# Patient Record
Sex: Male | Born: 1965 | Race: White | Hispanic: No | Marital: Married | Smoking: Never smoker
Health system: Southern US, Community
[De-identification: ages and names within clinical notes are randomized; demographics above are authoritative.]

## PROBLEM LIST (undated history)

## (undated) ENCOUNTER — Ambulatory Visit: Admission: EM | Discharge status: Absent without leave | Payer: Self-pay

## (undated) HISTORY — PX: NO PAST SURGERIES: SHX2092

---

## 2006-01-19 ENCOUNTER — Ambulatory Visit: Payer: Self-pay

## 2006-02-07 ENCOUNTER — Ambulatory Visit: Payer: Self-pay

## 2007-12-22 IMAGING — US ABDOMEN ULTRASOUND
1 series · 17 of 25 positions shown · non-contrast
Comparison: none

REASON FOR EXAM: RUQ pain
COMMENTS:

[Series 1: abdomen ultrasound · 17 of 58 slices shown]
[im 1/58]
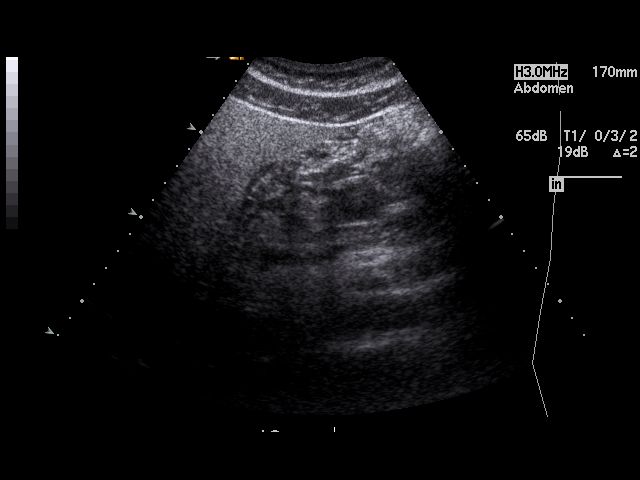
[im 5/58]
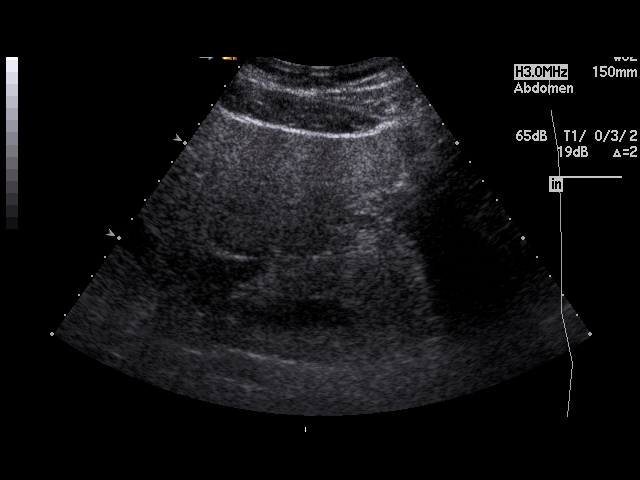
[im 8/58]
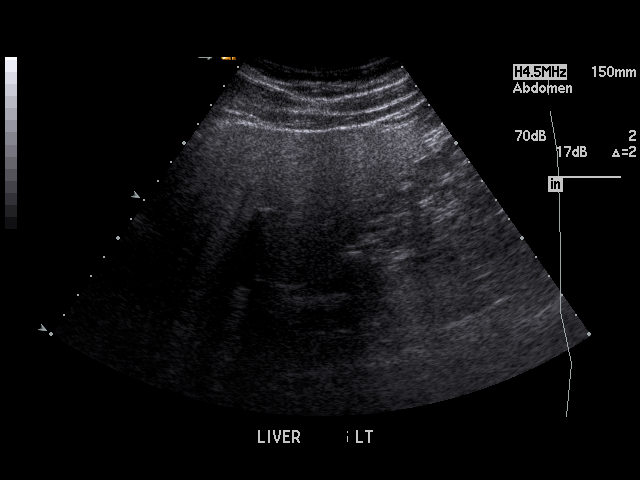
[im 12/58]
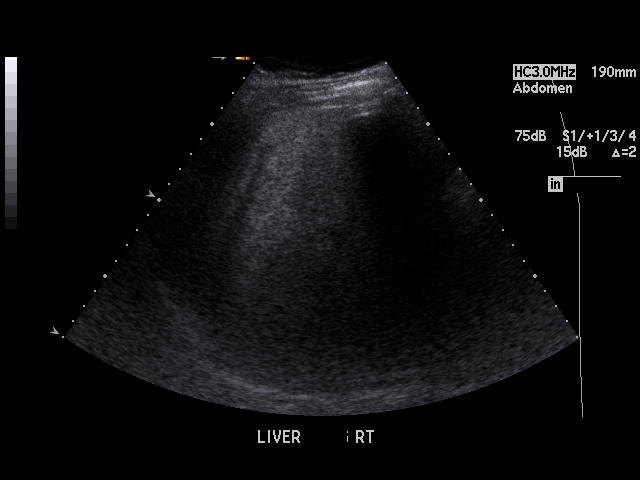
[im 15/58]
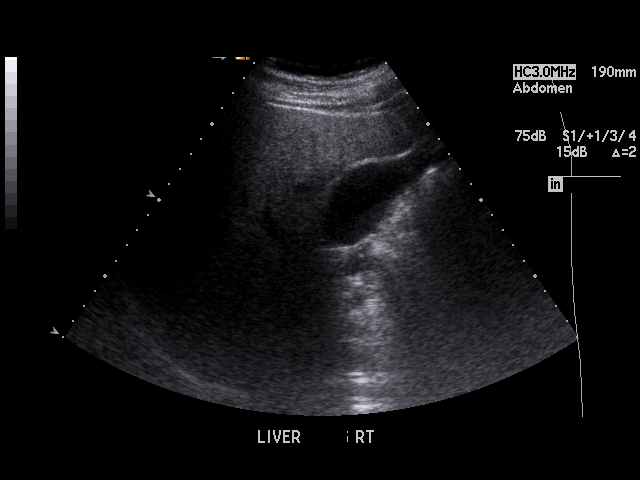
[im 20/58]
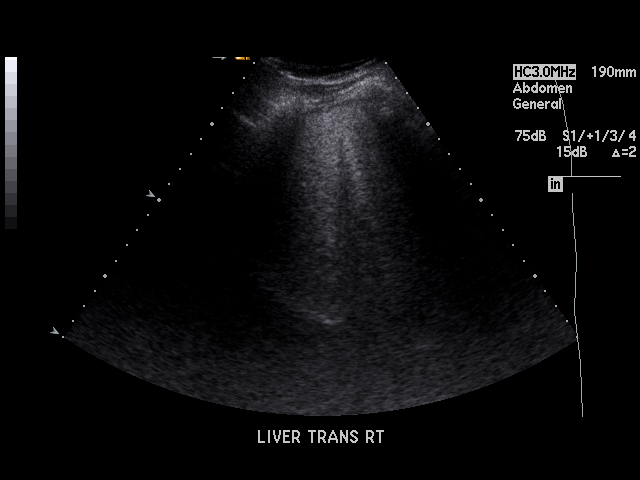
[im 22/58]
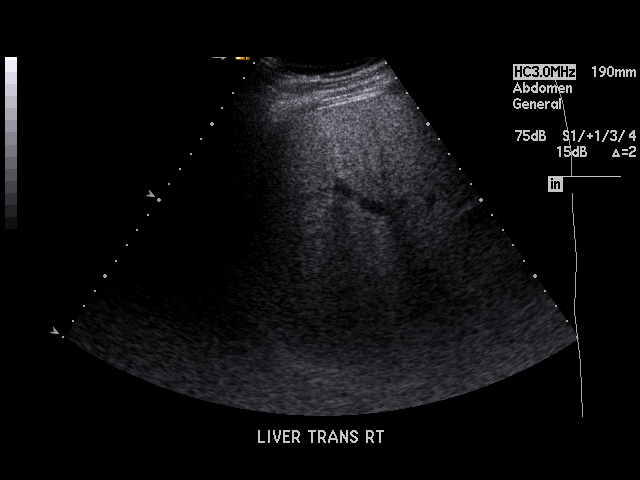
[im 27/58]
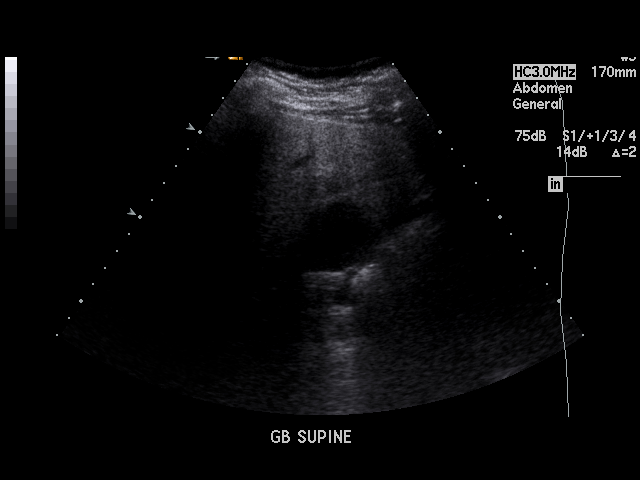
[im 29/58]
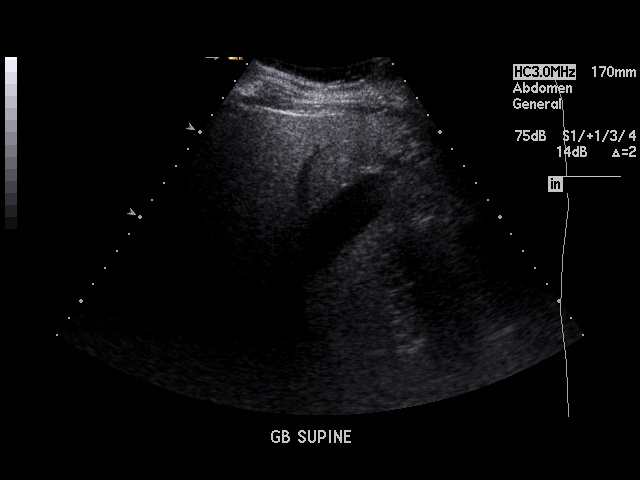
[im 31/58]
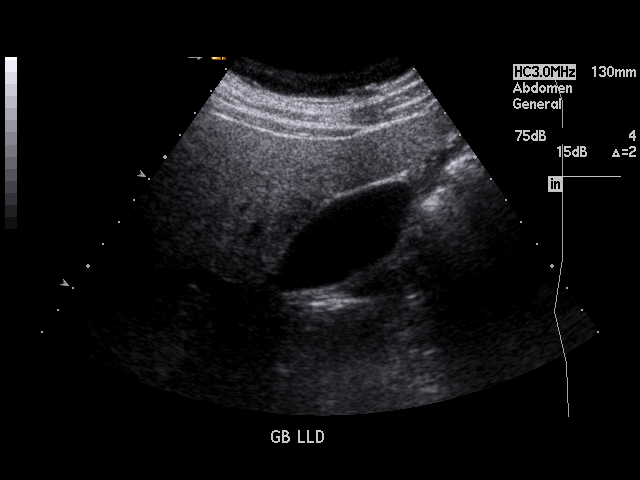
[im 36/58]
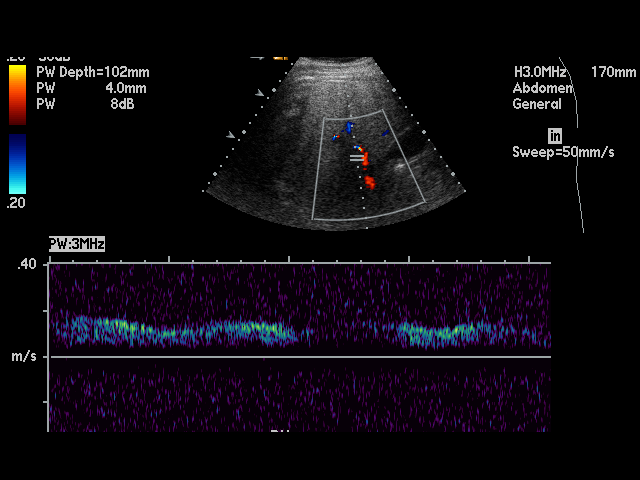
[im 39/58]
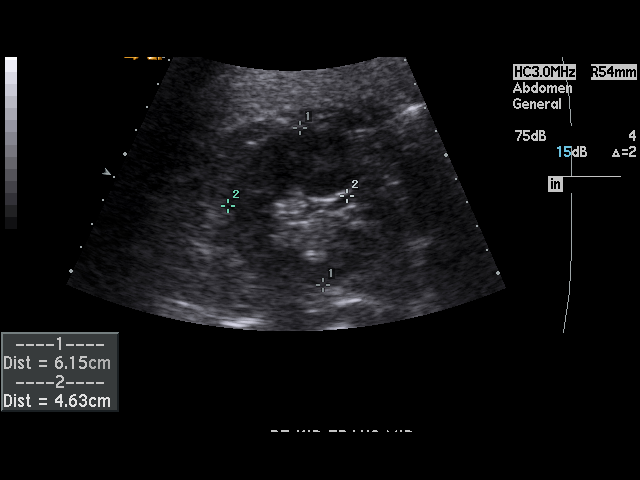
[im 43/58]
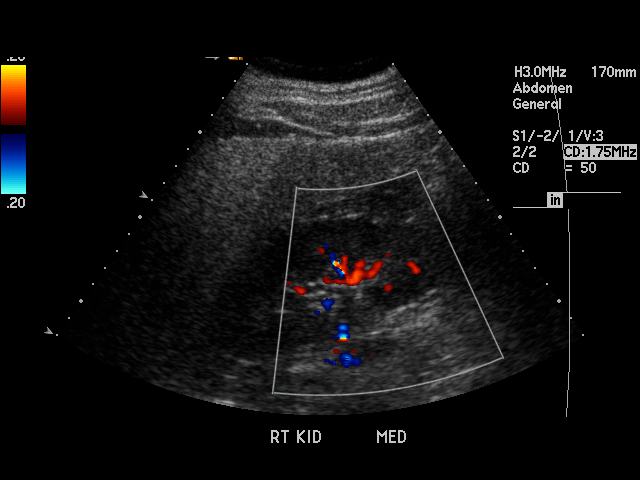
[im 46/58]
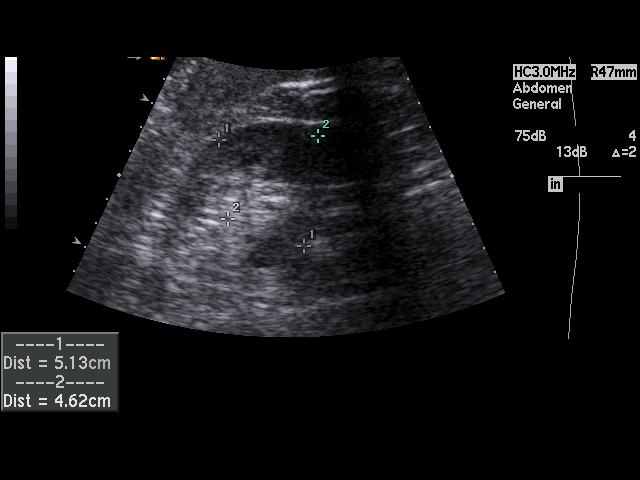
[im 50/58]
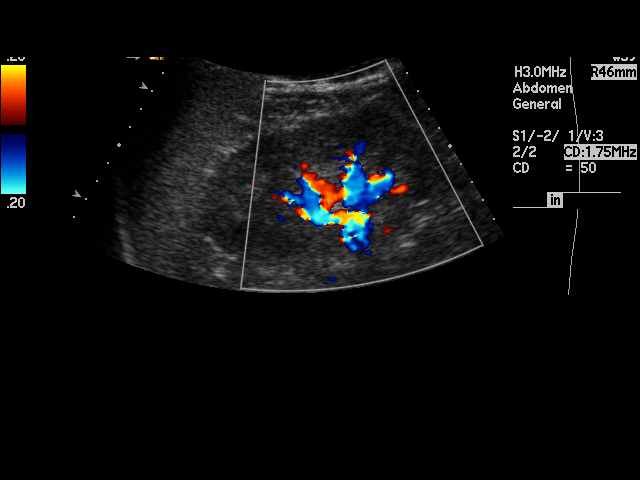
[im 53/58]
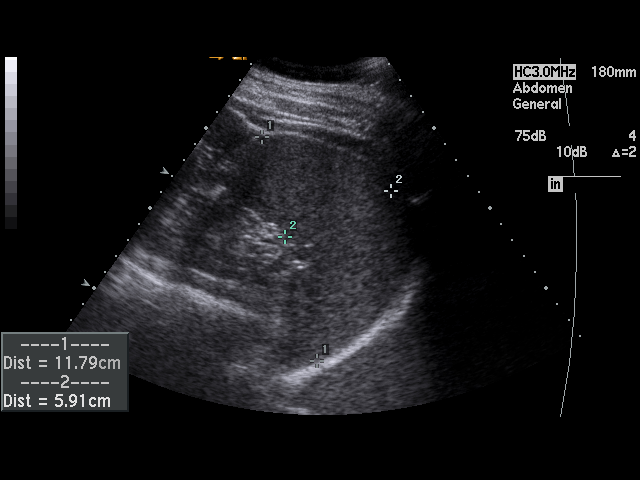
[im 58/58]
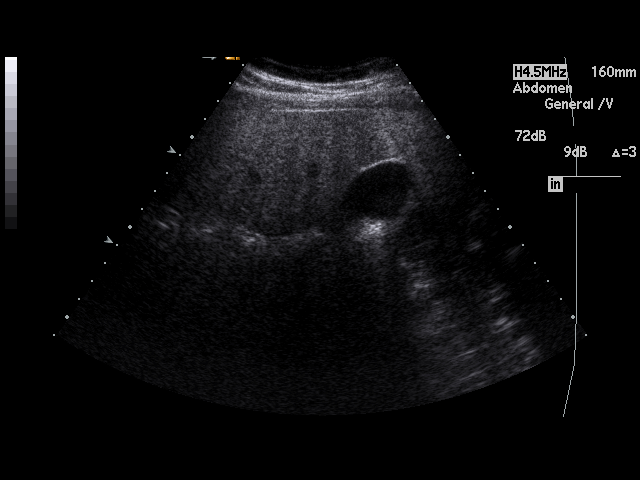

[17 of 25 positions shown; findings below may reference images not displayed]

PROCEDURE:     US  - US ABDOMEN GENERAL SURVEY  - January 19, 2006 [DATE]

RESULT:     The patient is complaining of RIGHT upper quadrant discomfort.
The gallbladder is adequately distended with no evidence of stones, wall
thickening, or pericholecystic fluid.  There is no sonographic Murphy's
sign.  The common bile duct is normal at 1.9 mm in diameter.  The liver
exhibits somewhat increased echotexture and it is difficult to penetrate
with the ultrasound beam suggesting fatty infiltration.  The portal venous
flow is normal in flow direction.  The pancreas can only be partially imaged
due to bowel gas and is grossly normal.  The spleen is normal in echotexture
and size measuring 12.7 cm in greatest dimension.  Survey views of the
abdominal aorta exhibit no evidence of aneurysm. The kidneys are normal in
echotexture and measure approximately 12.9 cm in length on the RIGHT and
12.8 cm in length on the LEFT.  There is no evidence of ascites.
IMPRESSION: I see no evidence of gallstones.

The liver exhibits parenchymal change consistent with fatty infiltration.

## 2009-01-03 ENCOUNTER — Ambulatory Visit: Payer: Self-pay | Admitting: Family Medicine

## 2010-12-14 ENCOUNTER — Ambulatory Visit: Payer: Self-pay | Admitting: Internal Medicine

## 2011-04-12 ENCOUNTER — Ambulatory Visit: Payer: Self-pay | Admitting: Family Medicine

## 2011-10-05 ENCOUNTER — Ambulatory Visit: Payer: Self-pay | Admitting: Internal Medicine

## 2011-10-08 LAB — BETA STREP CULTURE(ARMC)

## 2013-08-24 ENCOUNTER — Ambulatory Visit: Payer: Self-pay

## 2015-05-17 ENCOUNTER — Ambulatory Visit
Admission: EM | Admit: 2015-05-17 | Discharge: 2015-05-17 | Disposition: A | Discharge status: Home / Self Care | Payer: BLUE CROSS/BLUE SHIELD | Attending: Family Medicine | Admitting: Family Medicine

## 2015-05-17 ENCOUNTER — Encounter: Payer: Self-pay | Admitting: Emergency Medicine

## 2015-05-17 ENCOUNTER — Ambulatory Visit (INDEPENDENT_AMBULATORY_CARE_PROVIDER_SITE_OTHER): Payer: BLUE CROSS/BLUE SHIELD

## 2015-05-17 DIAGNOSIS — J189 Pneumonia, unspecified organism: Secondary | ICD-10-CM

## 2015-05-17 MED ORDER — HYDROCOD POLST-CPM POLST ER 10-8 MG/5ML PO SUER: 5.0000 mL | Freq: Two times a day (BID) | ORAL | Status: DC

## 2015-05-17 MED ORDER — AZITHROMYCIN 250 MG PO TABS: ORAL_TABLET | ORAL | Status: DC

## 2015-05-17 NOTE — Discharge Instructions (Signed)

## 2015-05-17 NOTE — ED Provider Notes (Signed)
CSN: [id number]     Arrival date & time 05/17/15  0909 History   First MD Initiated Contact with Patient 05/17/15 1058     Chief Complaint  Patient presents with  . URI   (Consider location/radiation/quality/duration/timing/severity/associated sxs/prior Treatment) HPI   49 year old gentleman who has had a cold symptoms upper respiratory for the last week. 2 days ago he began to develop a cough for pain in his chest when he coughed hard. He's had a fever for about 101 for 2 days although today he is afebrile this morning. Has been productive of a foul tasting brownish mucus he is having every time he coughs. He is a nonsmoker. His 8410-month-old daughter has had these symptoms before she has improved without a problem  History reviewed. No pertinent past medical history. Past Surgical History  Procedure Laterality Date  . No past surgeries     No family history on file. Social History  Substance Use Topics  . Smoking status: Never Smoker   . Smokeless tobacco: Never Used  . Alcohol Use: Yes    Review of Systems  Constitutional: Positive for fever and chills. Negative for diaphoresis, activity change, appetite change and fatigue.  HENT: Positive for congestion, postnasal drip, rhinorrhea and sinus pressure.   Respiratory: Positive for choking.   All other systems reviewed and are negative.   Allergies  Review of patient's allergies indicates no known allergies.  Home Medications   Prior to Admission medications   Medication Sig Start Date End Date Taking? Authorizing Provider  azithromycin (ZITHROMAX Z-PAK) 250 MG tablet Use as per package instructions 05/17/15   Lutricia FeilWilliam P Roemer, PA-C  chlorpheniramine-HYDROcodone [facility](TUSSIONEX PENNKINETIC ER) 10-8 MG/5ML SUER Take 5 mLs by mouth 2 (two) times daily. 05/17/15   Lutricia FeilWilliam P Roemer, PA-C   Meds Ordered and Administered this Visit  Medications - No data to display  BP 117/74 mmHg  Pulse 63  Temp(Src) 97.6 F (36.4 C) (Oral)   Resp 18  Ht 5\' 11"  (1.803 m)  Wt 245 lb (111.131 kg)  BMI 34.19 kg/m2  SpO2 99% No data found.   Physical Exam  Constitutional: He is oriented to person, place, and time. He appears well-developed and well-nourished. No distress.  HENT:  Head: Normocephalic and atraumatic.  TMs are dull bilaterally with decreased light reflex. Is tender to percussion over the maxillary sinuses bilaterally  Eyes: Conjunctivae are normal. Pupils are equal, round, and reactive to light.  Neck: Normal range of motion. Neck supple.  Pulmonary/Chest: Effort normal. No respiratory distress. He has no wheezes. He has rales.  Auscultation of the lungs shows a small patch of fine crackles on tussive in the right middle lobe.  Musculoskeletal: Normal range of motion. He exhibits no edema or tenderness.  Lymphadenopathy:    He has no cervical adenopathy.  Neurological: He is alert and oriented to person, place, and time.  Skin: Skin is warm and dry. He is not diaphoretic.  Psychiatric: He has a normal mood and affect. His behavior is normal. Judgment and thought content normal.  Nursing note and vitals reviewed.   ED Course  Procedures (including critical care time)  Labs Review Labs Reviewed - No data to display  Imaging Review Dg Chest 2 View  05/17/2015  CLINICAL DATA:  Cough.  Fever EXAM: CHEST  2 VIEW COMPARISON:  None. FINDINGS: The heart size and mediastinal contours are within normal limits. Lingular opacity compatible with pneumonia. The visualized skeletal structures are unremarkable. IMPRESSION: 1. Lingular pneumonia. Electronically  Signed   By: Signa Kell M.D.   On: 05/17/2015 11:38     Visual Acuity Review  Right Eye Distance:   Left Eye Distance:   Bilateral Distance:    Right Eye Near:   Left Eye Near:    Bilateral Near:         MDM   1. Lingular pneumonia    New Prescriptions   AZITHROMYCIN (ZITHROMAX Z-PAK) 250 MG TABLET    Use as per package instructions    CHLORPHENIRAMINE-HYDROCODONE (TUSSIONEX PENNKINETIC ER) 10-8 MG/5ML SUER    Take 5 mLs by mouth 2 (two) times daily.  Plan: 1. Test/x-ray results and diagnosis reviewed with patient 2. rx as per orders; risks, benefits, potential side effects reviewed with patient 3. Recommend supportive treatment with fluids and rest. Complete the antibiotics. Follow-up with [facility] in 4 weeks for repeat x-ray to confirm cure. He has good emergency room in the interim if there is any increase in symptoms or if he is worsening. 4. F/u prn if symptoms worsen or don't improve     Lutricia Feil, PA-C 05/17/15 1213

## 2015-05-17 NOTE — ED Notes (Signed)
Cough, congestion, fever 101 for 2 day

## 2016-09-22 ENCOUNTER — Ambulatory Visit
Admission: EM | Admit: 2016-09-22 | Discharge: 2016-09-22 | Disposition: A | Discharge status: Home / Self Care | Payer: BLUE CROSS/BLUE SHIELD | Attending: Family Medicine | Admitting: Family Medicine

## 2016-09-22 ENCOUNTER — Encounter: Payer: Self-pay | Admitting: *Deleted

## 2016-09-22 DIAGNOSIS — J069 Acute upper respiratory infection, unspecified: Secondary | ICD-10-CM | POA: Diagnosis not present

## 2016-09-22 DIAGNOSIS — B9789 Other viral agents as the cause of diseases classified elsewhere: Secondary | ICD-10-CM | POA: Diagnosis not present

## 2016-09-22 DIAGNOSIS — R05 Cough: Secondary | ICD-10-CM

## 2016-09-22 MED ORDER — BENZONATATE 100 MG PO CAPS: 100.0000 mg | ORAL_CAPSULE | Freq: Three times a day (TID) | ORAL | 0 refills | Status: DC | PRN

## 2016-09-22 MED ORDER — PREDNISONE 20 MG PO TABS: 40.0000 mg | ORAL_TABLET | Freq: Every day | ORAL | 0 refills | Status: DC

## 2016-09-22 MED ORDER — DOXYCYCLINE HYCLATE 100 MG PO CAPS: 100.0000 mg | ORAL_CAPSULE | Freq: Two times a day (BID) | ORAL | 0 refills | Status: DC

## 2016-09-22 MED ORDER — HYDROCOD POLST-CPM POLST ER 10-8 MG/5ML PO SUER: 5.0000 mL | Freq: Every evening | ORAL | 0 refills | Status: DC | PRN

## 2016-09-22 MED ORDER — ALBUTEROL SULFATE HFA 108 (90 BASE) MCG/ACT IN AERS: 2.0000 | INHALATION_SPRAY | RESPIRATORY_TRACT | 0 refills | Status: DC | PRN

## 2016-09-22 NOTE — ED Triage Notes (Signed)
Productive cough- green, head and chest congestion,x1 week.

## 2016-09-22 NOTE — ED Provider Notes (Signed)
[facility] ____________________________________________  Time seen: Approximately 6:51 PM  I have reviewed the triage vital signs and the nursing notes.   HISTORY  Chief Complaint Cough and Nasal Congestion   HPI Malik Chapman is a 51 y.o. male presenting for evaluation of 1 week of nasal congestion, post nasal drainage and cough. Patient reports initially started with nasal congestion and quickly progressed to chest congestion and coughing. Patient reports some soreness from coughing. States that he had does have a tickle in his throat that causes coughing denies sore throat. Denies chest pain or shortness of breath. Denies known sick contacts. Reports is continue to remain active. Reports he does have some seasonal allergy history recently and has been taking home antihistamine. Reports symptoms have been unresolved over-the-counter cough and congestion medications. Denies and fevers. Reports continues to eat and drink well. Denies fevers.   Denies chest pain, shortness of breath, abdominal pain, dysuria, extremity pain, extremity swelling or rash. Denies recent sickness. Denies recent antibiotic use.  Denies chronic medical problems. History reviewed. No pertinent past medical history.  There are no active problems to display for this patient.   Past Surgical History:  Procedure Laterality Date  . NO PAST SURGERIES       No current facility-administered medications for this encounter.   Current Outpatient Prescriptions:  .  albuterol (PROVENTIL HFA;VENTOLIN HFA) 108 (90 Base) MCG/ACT inhaler, Inhale 2 puffs into the lungs every 4 (four) hours as needed for wheezing., Disp: 1 Inhaler, Rfl: 0 .  benzonatate (TESSALON PERLES) 100 MG capsule, Take 1 capsule (100 mg total) by mouth 3 (three) times daily as needed., Disp: 15 capsule, Rfl: 0 .  chlorpheniramine-HYDROcodone (TUSSIONEX PENNKINETIC ER) 10-8 MG/5ML SUER, Take 5 mLs by mouth at bedtime as needed. do not  drive or operate machinery while taking as can cause drowsiness., Disp: 75 mL, Rfl: 0 .  doxycycline (VIBRAMYCIN) 100 MG capsule, Take 1 capsule (100 mg total) by mouth 2 (two) times daily., Disp: 20 capsule, Rfl: 0 .  predniSONE (DELTASONE) 20 MG tablet, Take 2 tablets (40 mg total) by mouth daily., Disp: 10 tablet, Rfl: 0  Allergies Patient has no known allergies.  History reviewed. No pertinent family history.  Social History Social History  Substance Use Topics  . Smoking status: Never Smoker  . Smokeless tobacco: Never Used  . Alcohol use Yes    Review of Systems Constitutional: No fever/chills Eyes: No visual changes. ENT: No sore throat. Cardiovascular: Denies chest pain. Respiratory: Denies shortness of breath. Gastrointestinal: No abdominal pain.  No nausea, no vomiting.  No diarrhea.  No constipation. Genitourinary: Negative for dysuria. Musculoskeletal: Negative for back pain. Skin: Negative for rash.  ____________________________________________   PHYSICAL EXAM:  VITAL SIGNS: ED Triage Vitals  Enc Vitals Group     BP 09/22/16 1722 (!) 145/79     Pulse Rate 09/22/16 1722 63     Resp 09/22/16 1722 16     Temp 09/22/16 1722 98.6 F (37 C)     Temp Source 09/22/16 1722 Oral     SpO2 09/22/16 1722 99 %     Weight 09/22/16 1724 250 lb (113.4 kg)     Height 09/22/16 1724  (1.803 m)     Head Circumference --      Peak Flow --      Pain Score 09/22/16 1848 4     Pain Loc --      Pain Edu? --      Excl.  in GC? --     Constitutional: Alert and oriented. Well appearing and in no acute distress. Eyes: Conjunctivae are normal. PERRL. EOMI. Head: Atraumatic. No sinus tenderness to palpation. No swelling. No erythema.  Ears: no erythema, normal TMs bilaterally.   Nose:Nasal congestion with clear rhinorrhea  Mouth/Throat: Mucous membranes are moist. No pharyngeal erythema. No tonsillar swelling or exudate.  Neck: No stridor.  No cervical spine tenderness to  palpation. Hematological/Lymphatic/Immunilogical: No cervical lymphadenopathy. Cardiovascular: Normal rate, regular rhythm. Grossly normal heart sounds.  Good peripheral circulation. Respiratory: Normal respiratory effort.  No retractions. No wheezes, rales or rhonchi. Good air movement. Dry intermittent cough noted in room with bronchospasm. No focal area of consolidation auscultated. Speaks in complete sentences. Musculoskeletal: Ambulatory with steady gait. No cervical, thoracic or lumbar tenderness to palpation. No lower extremity edema noted bilaterally. Neurologic:  Normal speech and language. No gait instability. Skin:  Skin appears warm, dry and intact. No rash noted. Psychiatric: Mood and affect are normal. Speech and behavior are normal.  ___________________________________________   LABS (all labs ordered are listed, but only abnormal results are displayed)  Labs Reviewed - No data to display  PROCEDURES Procedures    INITIAL IMPRESSION / ASSESSMENT AND PLAN / ED COURSE  Pertinent labs & imaging results that were available during my care of the patient were reviewed by me and considered in my medical decision making (see chart for details).  Well-appearing patient. No acute distress. Discussed the patient suspects some allergic rhinitis and viral upper respiratory infection with cough.  concerned with bronchospasm will empirically treat patient with oral doxycycline and treat patient with albuterol inhaler as needed as well as prednisone for 5 days. When necessary Tessalon Perles and Tussionex. Encourage Rest, Fluids and Supportive Care. Discussed indication, risks and benefits of medications with patient. Discussed strict follow-up and return parameters.  Discussed follow up with Primary care physician this week. Discussed follow up and return parameters including no resolution or any worsening concerns. Patient verbalized understanding and agreed to plan.    ____________________________________________   FINAL CLINICAL IMPRESSION(S) / ED DIAGNOSES  Final diagnoses:  Viral upper respiratory tract infection with cough     Discharge Medication List as of 09/22/2016  6:46 PM    START taking these medications   Details  albuterol (PROVENTIL HFA;VENTOLIN HFA) 108 (90 Base) MCG/ACT inhaler Inhale 2 puffs into the lungs every 4 (four) hours as needed for wheezing., Starting Wed 09/22/2016, Normal    benzonatate (TESSALON PERLES) 100 MG capsule Take 1 capsule (100 mg total) by mouth 3 (three) times daily as needed., Starting Wed 09/22/2016, Normal    doxycycline (VIBRAMYCIN) 100 MG capsule Take 1 capsule (100 mg total) by mouth 2 (two) times daily., Starting Wed 09/22/2016, Normal    predniSONE (DELTASONE) 20 MG tablet Take 2 tablets (40 mg total) by mouth daily., Starting Wed 09/22/2016, Normal        Note: This dictation was prepared with Dragon dictation along with smaller phrase technology. Any transcriptional errors that result from this process are unintentional.         Renford Dills, NP 09/22/16 1856    Renford Dills, NP 09/22/16 1857

## 2016-09-22 NOTE — Discharge Instructions (Signed)
Take medication as prescribed. Rest. Drink plenty of fluids.  ° °Follow up with your primary care physician this week as needed. Return to Urgent care for new or worsening concerns.  ° °

## 2017-04-18 IMAGING — CR DG CHEST 2V
2 series · 2 of 2 positions shown · non-contrast
Comparison: None.

CLINICAL DATA: Cough.  Fever

EXAM:
CHEST  2 VIEW

[chest pa]
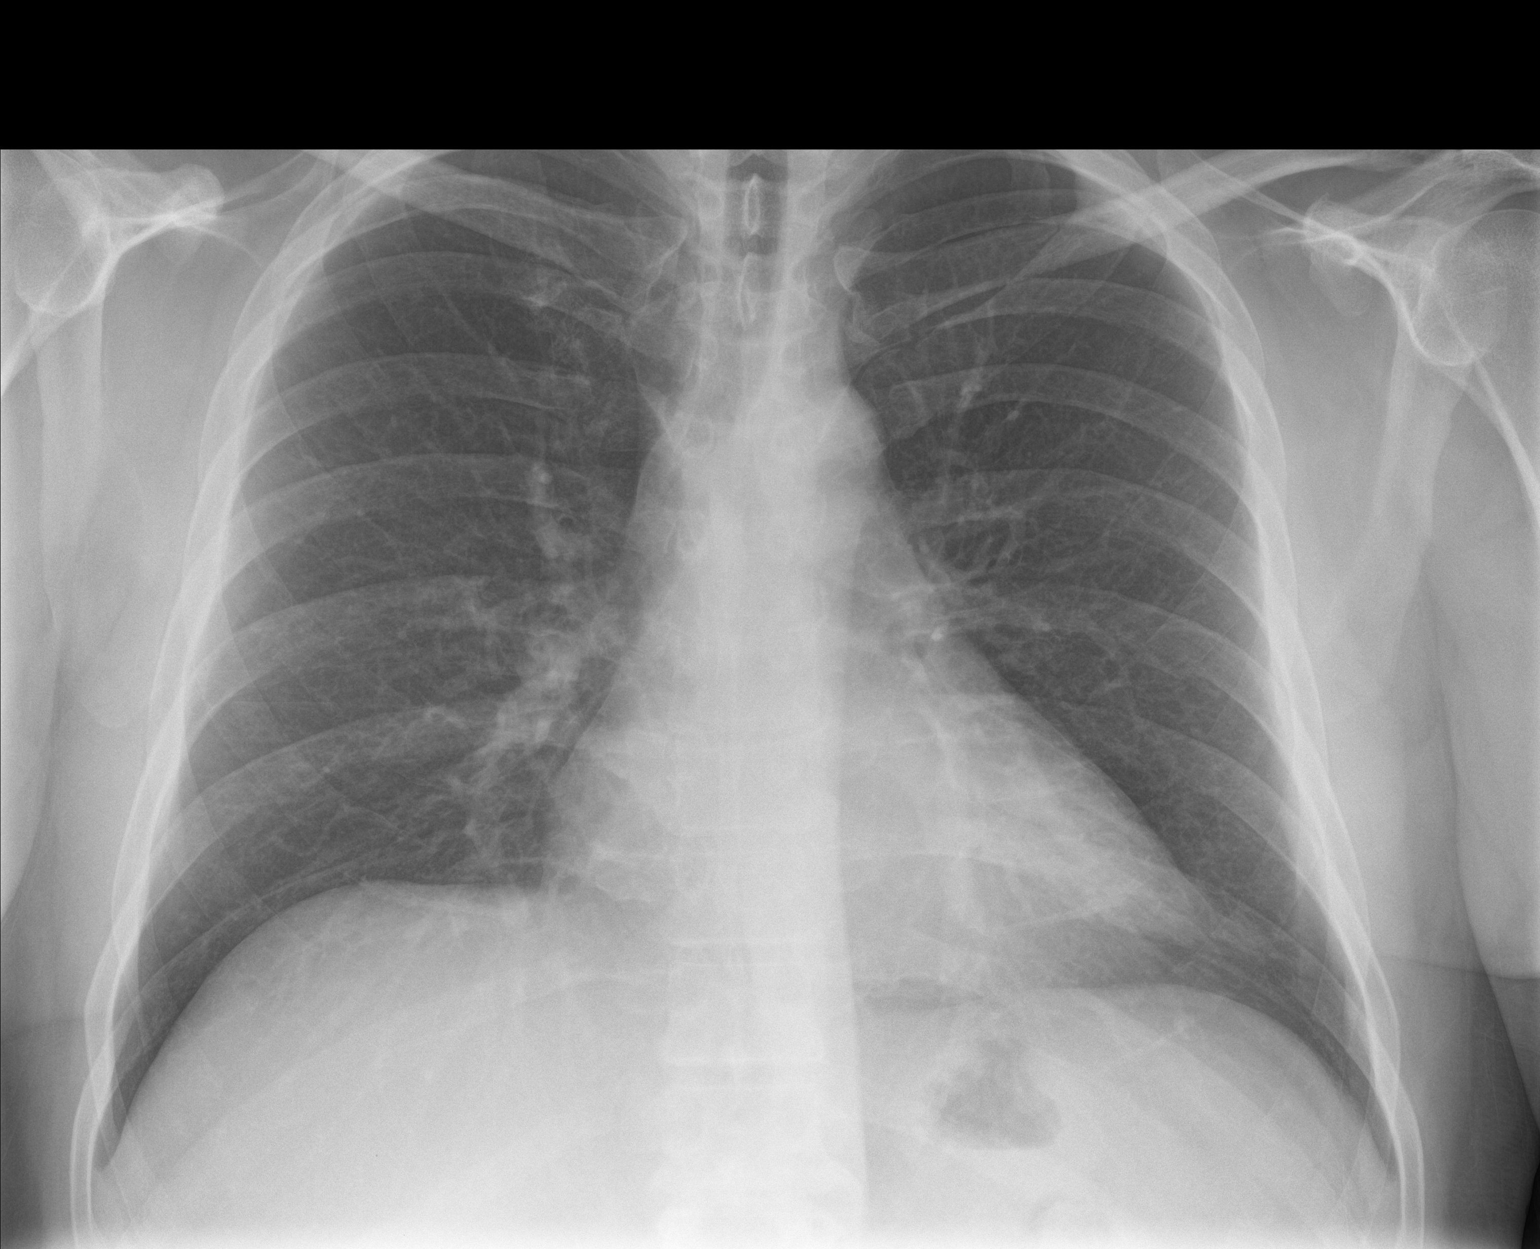

[chest lat]
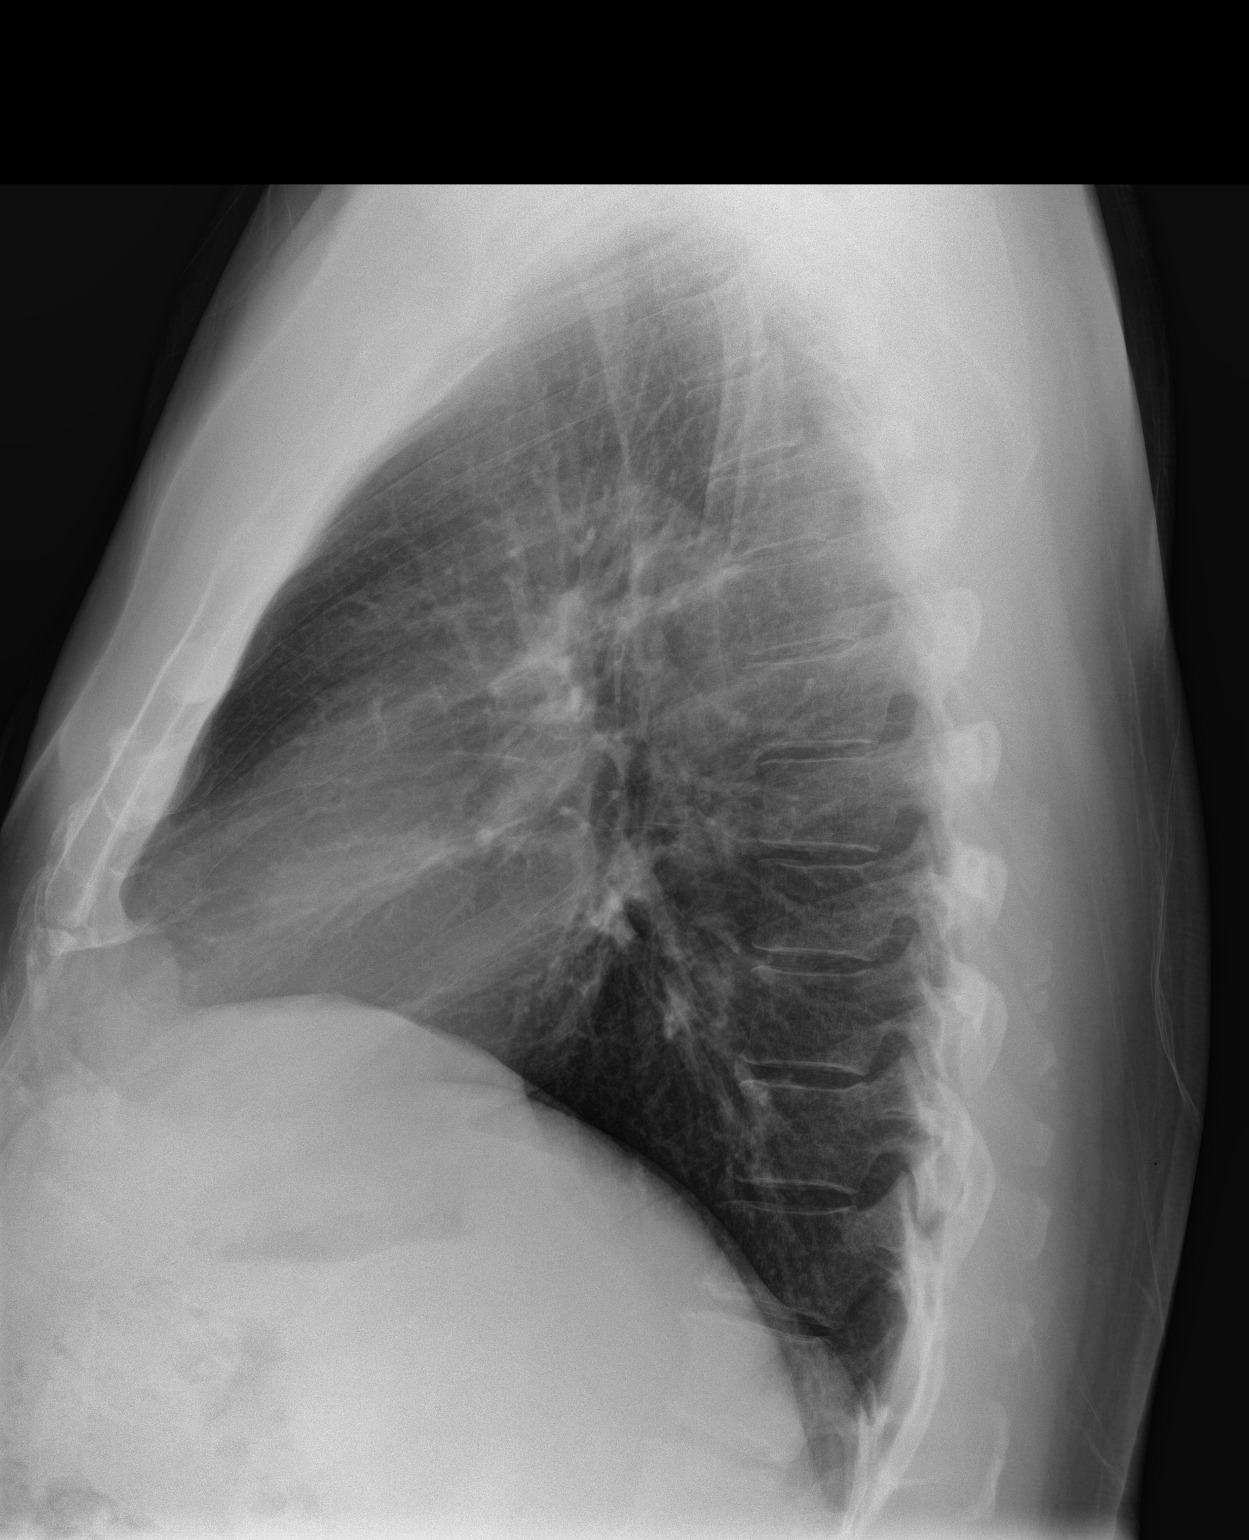

[2 of 2 positions shown; findings below may reference images not displayed]

FINDINGS: The heart size and mediastinal contours are within normal limits.
Lingular opacity compatible with pneumonia. The visualized skeletal
structures are unremarkable.
IMPRESSION: 1. Lingular pneumonia.

## 2018-04-19 ENCOUNTER — Ambulatory Visit
Admission: EM | Admit: 2018-04-19 | Discharge: 2018-04-19 | Disposition: A | Discharge status: Home / Self Care | Payer: BLUE CROSS/BLUE SHIELD | Attending: Emergency Medicine | Admitting: Emergency Medicine

## 2018-04-19 ENCOUNTER — Other Ambulatory Visit: Payer: Self-pay

## 2018-04-19 ENCOUNTER — Encounter: Payer: Self-pay | Admitting: Emergency Medicine

## 2018-04-19 DIAGNOSIS — R05 Cough: Secondary | ICD-10-CM

## 2018-04-19 DIAGNOSIS — R059 Cough, unspecified: Secondary | ICD-10-CM

## 2018-04-19 MED ORDER — ALBUTEROL SULFATE HFA 108 (90 BASE) MCG/ACT IN AERS: 1.0000 | INHALATION_SPRAY | Freq: Four times a day (QID) | RESPIRATORY_TRACT | 0 refills | Status: AC | PRN

## 2018-04-19 MED ORDER — AEROCHAMBER PLUS MISC: 2 refills | Status: AC

## 2018-04-19 MED ORDER — DOXYCYCLINE HYCLATE 100 MG PO CAPS: 100.0000 mg | ORAL_CAPSULE | Freq: Two times a day (BID) | ORAL | 0 refills | Status: AC

## 2018-04-19 MED ORDER — BENZONATATE 200 MG PO CAPS: 200.0000 mg | ORAL_CAPSULE | Freq: Three times a day (TID) | ORAL | 0 refills | Status: AC | PRN

## 2018-04-19 NOTE — ED Provider Notes (Signed)
HPI  SUBJECTIVE:  Malik Chapman is a 52 y.o. male who presents with 1 month of a cough, nasal congestion, rhinorrhea, "tickle in my chest", chest congestion, wheezing starting after having a URI.  States he had another URI last week.  No postnasal drip, sinus pain or pressure, upper dental pain.  States his cough is productive of white phlegm.  No chest pain.  No allergy symptoms.  No shortness of breath.  No fevers.  No GERD symptoms, no Double sickening.  He states that his cough never resolved - but it is not getting worse.  He has had similar symptoms before , and it turned into a pneumonia.  No antibiotics in the past 3 months.  No antipyretic in the past 4 to 6 hours.  He has been taking Mucinex D with improvement of symptoms.  No aggravating factors.  When he had similar symptoms before, he states that albuterol really helped him.  Past medical history negative for hypertension.  Not on any ACE inhibitors.  No history of asthma, eczema, COPD, smoking, diabetes, hypertension.  PMD: Dr[facility] Sherrie [facility]ache at [facility].    History reviewed. No pertinent past medical history.  Past Surgical History:  Procedure Laterality Date  . NO PAST SURGERIES      History reviewed. No pertinent family history.  Social History   Tobacco Use  . Smoking status: Never Smoker  . Smokeless tobacco: Never Used  Substance Use Topics  . Alcohol use: Yes  . Drug use: No    No current facility-administered medications for this encounter.   Current Outpatient Medications:  .  albuterol (PROVENTIL HFA;VENTOLIN HFA) 108 (90 Base) MCG/ACT inhaler, Inhale 1-2 puffs into the lungs every 6 (six) hours as needed for wheezing or shortness of breath., Disp: 1 Inhaler, Rfl: 0 .  benzonatate (TESSALON) 200 MG capsule, Take 1 capsule (200 mg total) by mouth 3 (three) times daily as needed for cough., Disp: 30 capsule, Rfl: 0 .  doxycycline (VIBRAMYCIN) 100 MG capsule, Take 1 capsule (100 mg total) by mouth 2  (two) times daily for 7 days., Disp: 14 capsule, Rfl: 0 .  Spacer/Aero-Holding Chambers (AEROCHAMBER PLUS) inhaler, Use as instructed, Disp: 1 each, Rfl: 2  No Known Allergies   ROS  As noted in HPI.   Physical Exam  BP 125/84 (BP Location: Right Arm)   Pulse (!) 58   Temp 99.1 F (37.3 C) (Oral)   Resp 18   Ht 5\' 11"  (1.803 m)   Wt 104.3 kg   SpO2 98%   BMI 32.08 kg/m   Constitutional: Well developed, well nourished, no acute distress Eyes:  EOMI, conjunctiva normal bilaterally HENT: Normocephalic, atraumatic,mucus membranes moist. no appreciable nasal congestion.  Normal turbinates.  No frontal, maxillary sinus tenderness.  No obvious postnasal drip.  No cobblestoning. Respiratory: Normal inspiratory effort good air movement, lungs clear bilaterally.  No chest wall tenderness Cardiovascular: Normal rate regular rhythm, no murmurs, rubs, gallops GI: nondistended skin: No rash, skin intact Musculoskeletal: no deformities Neurologic: Alert & oriented x 3, no focal neuro deficits Psychiatric: Speech and behavior appropriate   ED Course   Medications - No data to display  No orders of the defined types were placed in this encounter.   No results found for this or any previous visit (from the past 24 hour(s)). No results found.  ED Clinical Impression  Cough   ED Assessment/Plan  Could be post viral cough syndrome, however given duration of symptoms, will send  home with doxycycline which will also cover a possible sinus infection though he does not have any sinus tenderness.  Doubt pneumonia in the absence of fevers, hypoxia, focal lung findings, so chest x-ray was not done.  He will continue Mucinex DM.  He states that albuterol helped him significantly last time he had this, so we will send him home with another albuterol inhaler with a spacer.  2 puffs every 4-6 hours, back off on this as he starts to feel better.  Tessalon Perles.  Follow-up with his primary care  physician in 1 week if not getting any better.  Discussed  MDM, treatment plan, and plan for follow-up with patient. . patient agrees with plan.   Meds ordered this encounter  Medications  . albuterol (PROVENTIL HFA;VENTOLIN HFA) 108 (90 Base) MCG/ACT inhaler    Sig: Inhale 1-2 puffs into the lungs every 6 (six) hours as needed for wheezing or shortness of breath.    Dispense:  1 Inhaler    Refill:  0  . Spacer/Aero-Holding Chambers (AEROCHAMBER PLUS) inhaler    Sig: Use as instructed    Dispense:  1 each    Refill:  2  . benzonatate (TESSALON) 200 MG capsule    Sig: Take 1 capsule (200 mg total) by mouth 3 (three) times daily as needed for cough.    Dispense:  30 capsule    Refill:  0  . doxycycline (VIBRAMYCIN) 100 MG capsule    Sig: Take 1 capsule (100 mg total) by mouth 2 (two) times daily for 7 days.    Dispense:  14 capsule    Refill:  0    *This clinic note was created using Scientist, clinical (histocompatibility and immunogenetics)Dragon dictation software. Therefore, there may be occasional mistakes despite careful proofreading.   ?    Domenick GongMortenson, Ashaz Robling, MD 04/19/18 1756

## 2018-04-19 NOTE — Discharge Instructions (Addendum)
Finish the antibiotics, even if you feel better.  The Tessalon will help with cough during the day.  Continue Mucinex DM.  2 puffs from your albuterol inhaler using your spacer every 4-6 hours, may back off on this as you start to feel better.  Follow up with your primary care physician a week if you are not getting any better.

## 2018-04-19 NOTE — ED Triage Notes (Signed)
Patient c/o cough and nasal congestion that started 1 month ago. Patient has tried OTC Mucinex for his symptoms x 7 days.
